# Patient Record
Sex: Female | Born: 1935 | Race: White | Hispanic: No | Smoking: Never smoker
Health system: Southern US, Community
[De-identification: ages and names within clinical notes are randomized; demographics above are authoritative.]

## PROBLEM LIST (undated history)

## (undated) DIAGNOSIS — J45909 Unspecified asthma, uncomplicated: Secondary | ICD-10-CM

## (undated) HISTORY — PX: ABDOMINAL HYSTERECTOMY: SHX81

## (undated) HISTORY — PX: APPENDECTOMY: SHX54

---

## 2013-10-09 ENCOUNTER — Ambulatory Visit: Payer: Self-pay | Admitting: Physical Therapy

## 2020-12-28 ENCOUNTER — Encounter (HOSPITAL_BASED_OUTPATIENT_CLINIC_OR_DEPARTMENT_OTHER): Payer: Self-pay | Admitting: Emergency Medicine

## 2020-12-28 ENCOUNTER — Other Ambulatory Visit: Payer: Self-pay

## 2020-12-28 ENCOUNTER — Emergency Department (HOSPITAL_BASED_OUTPATIENT_CLINIC_OR_DEPARTMENT_OTHER): Payer: Medicare Other

## 2020-12-28 ENCOUNTER — Emergency Department (HOSPITAL_BASED_OUTPATIENT_CLINIC_OR_DEPARTMENT_OTHER)
Admission: EM | Admit: 2020-12-28 | Discharge: 2020-12-28 | Disposition: A | Discharge status: Home / Self Care | Payer: Medicare Other | Attending: Emergency Medicine | Admitting: Emergency Medicine

## 2020-12-28 DIAGNOSIS — S52532A Colles' fracture of left radius, initial encounter for closed fracture: Secondary | ICD-10-CM

## 2020-12-28 DIAGNOSIS — J45909 Unspecified asthma, uncomplicated: Secondary | ICD-10-CM | POA: Diagnosis not present

## 2020-12-28 DIAGNOSIS — W1839XA Other fall on same level, initial encounter: Secondary | ICD-10-CM | POA: Diagnosis not present

## 2020-12-28 DIAGNOSIS — Y9389 Activity, other specified: Secondary | ICD-10-CM | POA: Insufficient documentation

## 2020-12-28 DIAGNOSIS — S6992XA Unspecified injury of left wrist, hand and finger(s), initial encounter: Secondary | ICD-10-CM | POA: Diagnosis present

## 2020-12-28 HISTORY — DX: Unspecified asthma, uncomplicated: J45.909

## 2020-12-28 NOTE — ED Triage Notes (Signed)
Pt reports while doing yard work yesterday she fell backwards and used her left hand to catch her fall. Pt presents with redness and swelling to left wrist.

## 2020-12-28 NOTE — ED Provider Notes (Addendum)
[facility] EMERGENCY DEPARTMENT Provider Note   CSN: [id number] Arrival date & time: 12/28/20  1446     History Chief Complaint  Patient presents with  . Fall    Amber Serrano is a 85 y.o. female.  Patient is a 85 year old female who presents with wrist pain after a fall.  She states that yesterday she was pulling some weeds and she fell backward, using her left hand to catch her.  She has pain in her left wrist with some swelling.  She denies any other injuries.  She did not hit her head.  She denies any numbness or weakness in the hand.        Past Medical History:  Diagnosis Date  . Asthma     There are no problems to display for this patient.   Past Surgical History:  Procedure Laterality Date  . ABDOMINAL HYSTERECTOMY    . APPENDECTOMY       OB History   No obstetric history on file.     No family history on file.  Social History   Tobacco Use  . Smoking status: Never Smoker  . Smokeless tobacco: Never Used  Vaping Use  . Vaping Use: Never used  Substance Use Topics  . Alcohol use: Never  . Drug use: Never    Home Medications Prior to Admission medications   Not on File    Allergies    Patient has no known allergies.  Review of Systems   Review of Systems  Constitutional: Negative for fever.  Gastrointestinal: Negative for nausea and vomiting.  Musculoskeletal: Positive for arthralgias and joint swelling. Negative for back pain and neck pain.  Skin: Negative for wound.  Neurological: Negative for weakness, numbness and headaches.    Physical Exam Updated Vital Signs BP (!) 152/78 (BP Location: Right Arm)   Pulse 80   Temp 98.2 F (36.8 C) (Oral)   Resp 17   Ht 5\' 10"  (1.778 m)   Wt 56.7 kg   SpO2 98%   BMI 17.94 kg/m   Physical Exam Constitutional:      Appearance: She is well-developed.  HENT:     Head: Normocephalic and atraumatic.  Cardiovascular:     Rate and Rhythm: Normal rate.  Pulmonary:     Effort:  Pulmonary effort is normal.  Musculoskeletal:        General: Tenderness present.     Cervical back: Normal range of motion and neck supple.     Comments: Patient has tenderness over the wrist on the left at the distal radius.  She has some mild swelling and ecchymosis to the area.  No pain to the hand or elbow.  She has normal sensation and motor function distally.  No open wounds.  Radial pulses are intact.  Skin:    General: Skin is warm and dry.  Neurological:     Mental Status: She is alert and oriented to person, place, and time.     ED Results / Procedures / Treatments   Labs (all labs ordered are listed, but only abnormal results are displayed) Labs Reviewed - No data to display  EKG None  Radiology DG Wrist Complete Left  Result Date: 12/28/2020 CLINICAL DATA:  Left wrist pain and swelling after fall yesterday. EXAM: LEFT WRIST - COMPLETE 3+ VIEW COMPARISON:  None. FINDINGS: Acute mildly impacted fracture of the distal radial metaphysis without definite intra-articular extension. No additional fracture. No dislocation. Joint spaces are preserved. Osteopenia. Diffuse soft tissue swelling  about the wrist. IMPRESSION: 1. Acute mildly impacted fracture of the distal radius. Electronically Signed   By: Obie Dredge M.D.   On: 12/28/2020 16:22    Procedures Procedures   Medications Ordered in ED Medications - No data to display  ED Course  I have reviewed the triage vital signs and the nursing notes.  Pertinent labs & imaging results that were available during my care of the patient were reviewed by me and considered in my medical decision making (see chart for details).    MDM Rules/Calculators/A&P                         Patient has an impacted distal radius fracture.  She was placed in a thumb spica splint.  She uses a Rollator but says that she has been able to use it okay since yesterday with mostly one-handed and using the other hand just for support.  Her pain is  well controlled with over-the-counter medicines.  She has an orthopedist with [facility] that she will follow-up with.  Return precautions were given. Final Clinical Impression(s) / ED Diagnoses Final diagnoses:  Closed Colles' fracture of left radius, initial encounter    Rx / DC Orders ED Discharge Orders    None       Rolan Bucco, MD 12/28/20 1635    Rolan Bucco, MD 12/28/20 1635

## 2021-10-14 IMAGING — DX DG WRIST COMPLETE 3+V*L*
3 series · 3 of 3 positions shown · non-contrast
Comparison: None.

CLINICAL DATA: Left wrist pain and swelling after fall yesterday.

EXAM:
LEFT WRIST - COMPLETE 3+ VIEW

[wrist pa]
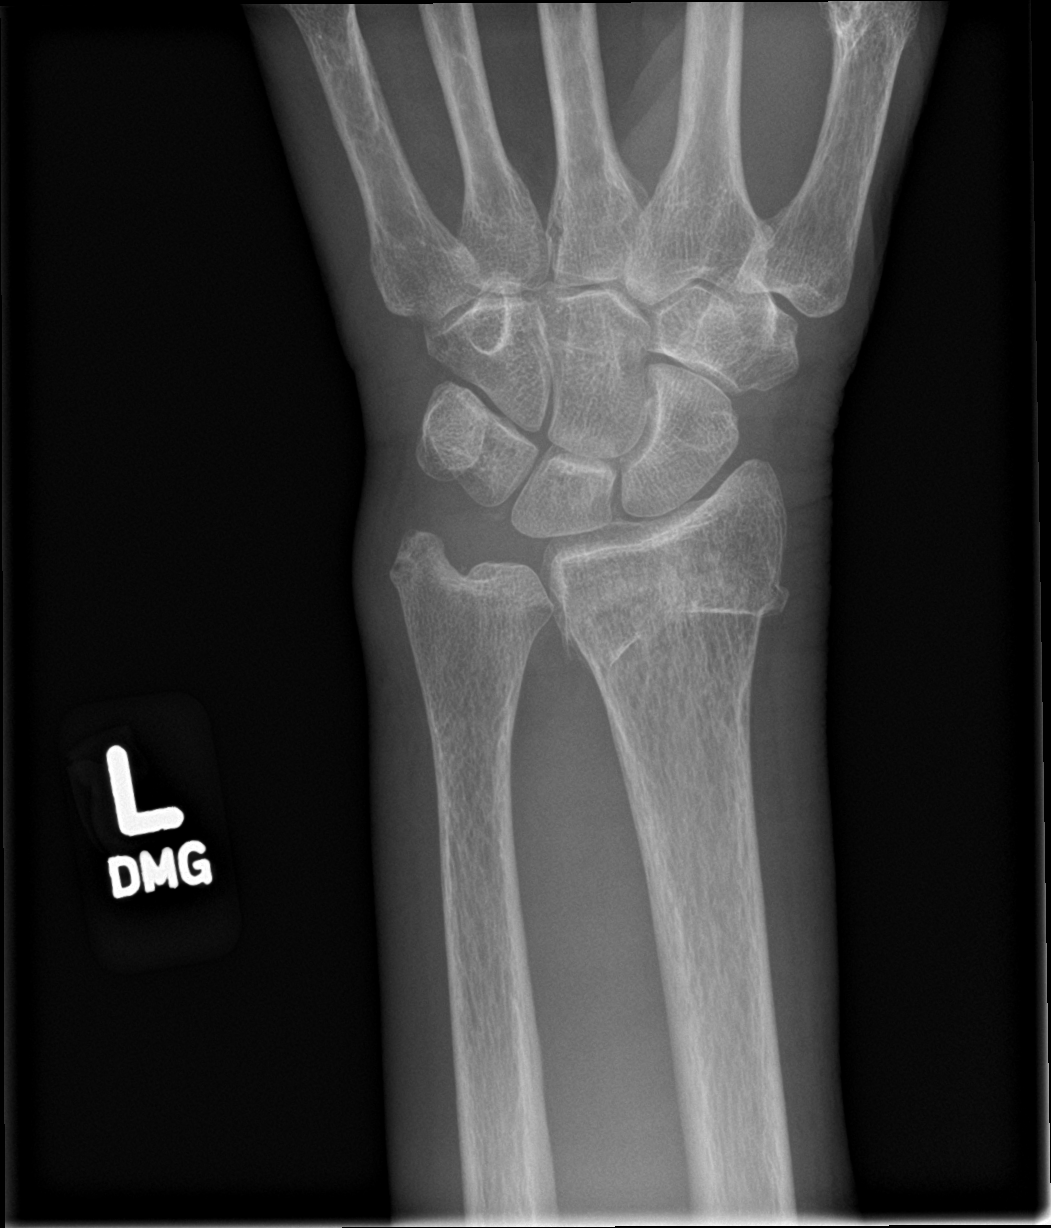

[wrist obl]
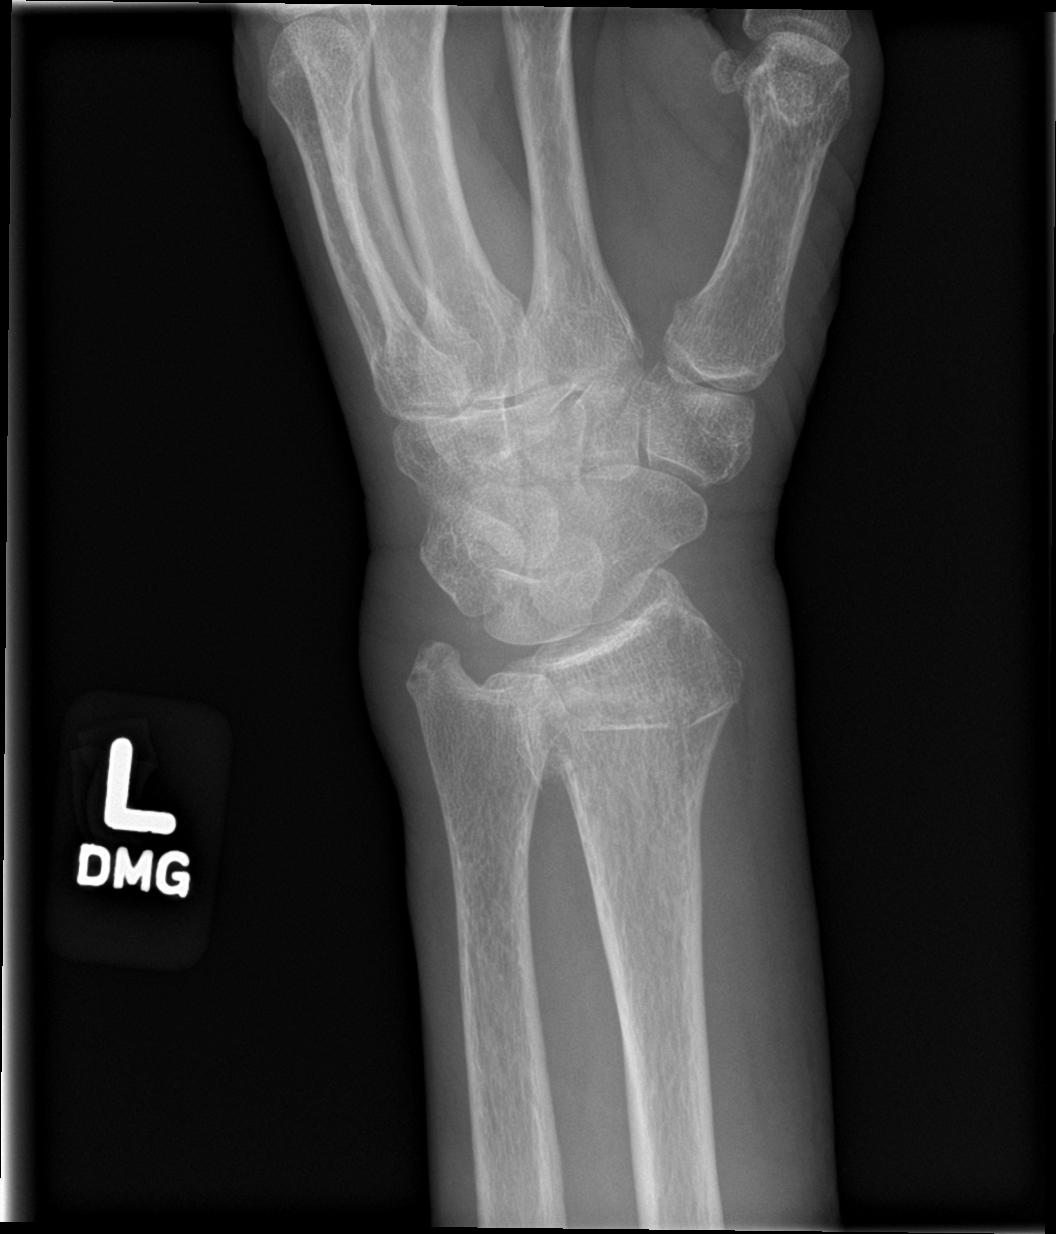

[wrist lat]
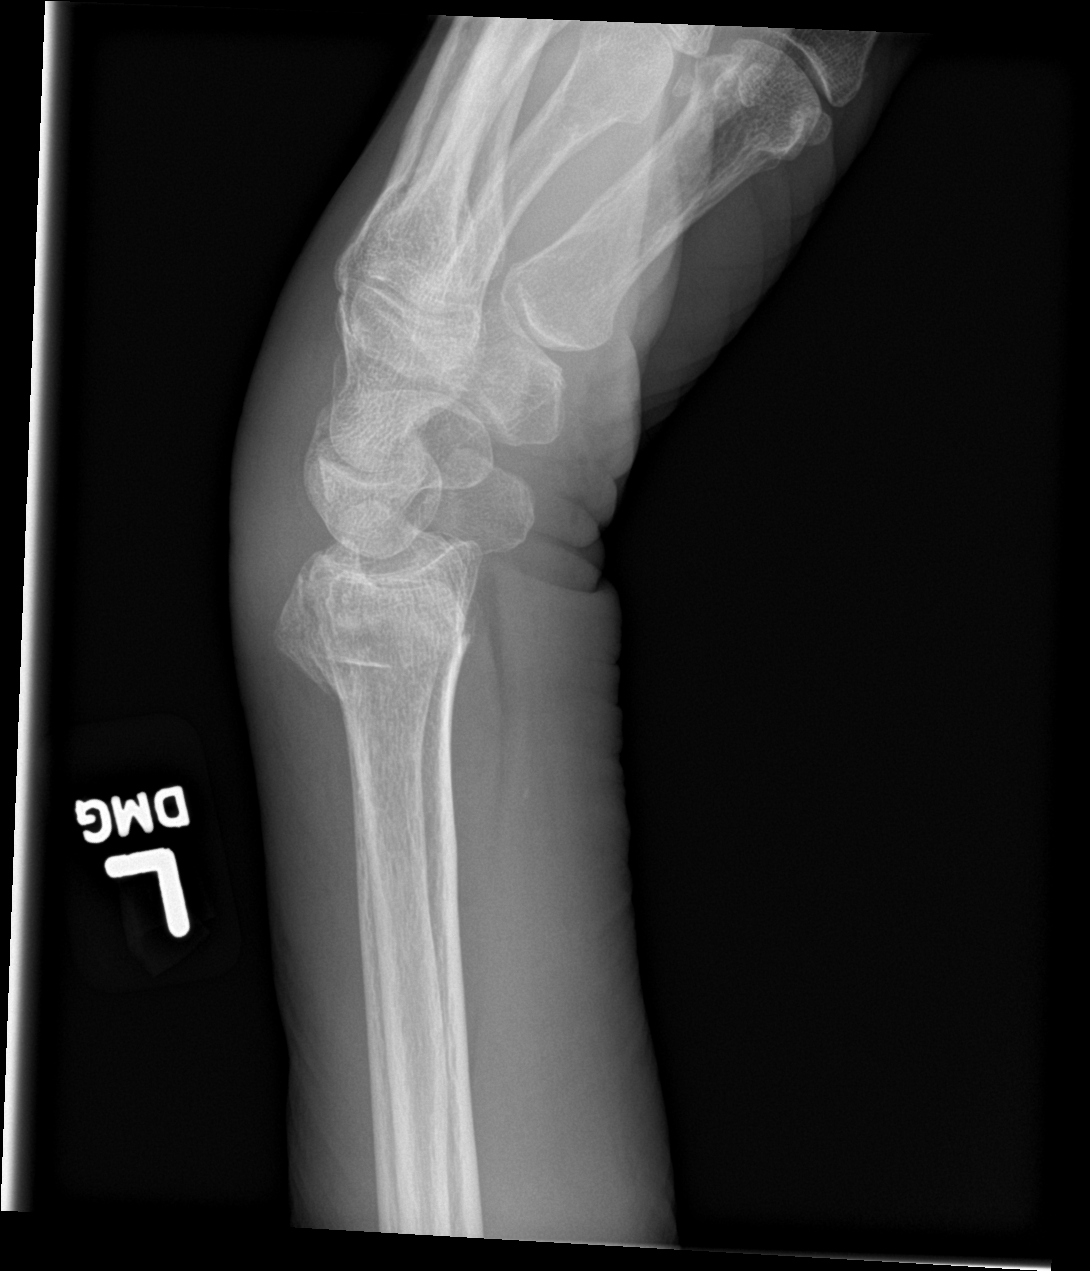

[3 of 3 positions shown; findings below may reference images not displayed]

FINDINGS: Acute mildly impacted fracture of the distal radial metaphysis
without definite intra-articular extension. No additional fracture.
No dislocation. Joint spaces are preserved. Osteopenia. Diffuse soft
tissue swelling about the wrist.
IMPRESSION: 1. Acute mildly impacted fracture of the distal radius.
